# Patient Record
Sex: Male | Born: 2000 | Race: Black or African American | Hispanic: No | Marital: Single | Smoking: Never smoker
Health system: Southern US, Community
[De-identification: ages and names within clinical notes are randomized; demographics above are authoritative.]

## PROBLEM LIST (undated history)

## (undated) DIAGNOSIS — F419 Anxiety disorder, unspecified: Secondary | ICD-10-CM

---
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
## 2019-04-05 ENCOUNTER — Encounter (HOSPITAL_BASED_OUTPATIENT_CLINIC_OR_DEPARTMENT_OTHER): Payer: Self-pay | Admitting: *Deleted

## 2019-04-05 ENCOUNTER — Emergency Department (HOSPITAL_BASED_OUTPATIENT_CLINIC_OR_DEPARTMENT_OTHER)
Admission: EM | Admit: 2019-04-05 | Discharge: 2019-04-05 | Disposition: A | Discharge status: Home / Self Care | Payer: Medicaid Other | Attending: Emergency Medicine | Admitting: Emergency Medicine

## 2019-04-05 ENCOUNTER — Other Ambulatory Visit: Payer: Self-pay

## 2019-04-05 DIAGNOSIS — Z113 Encounter for screening for infections with a predominantly sexual mode of transmission: Secondary | ICD-10-CM | POA: Diagnosis not present

## 2019-04-05 DIAGNOSIS — N342 Other urethritis: Secondary | ICD-10-CM | POA: Diagnosis not present

## 2019-04-05 DIAGNOSIS — Z79899 Other long term (current) drug therapy: Secondary | ICD-10-CM | POA: Insufficient documentation

## 2019-04-05 DIAGNOSIS — R300 Dysuria: Secondary | ICD-10-CM | POA: Diagnosis present

## 2019-04-05 LAB — URINALYSIS, ROUTINE W REFLEX MICROSCOPIC
Glucose, UA: NEGATIVE mg/dL
Hgb urine dipstick: NEGATIVE
Ketones, ur: 15 mg/dL — AB
Nitrite: NEGATIVE
Protein, ur: NEGATIVE mg/dL
Specific Gravity, Urine: >1.03 — ABNORMAL HIGH (ref 1.005–1.030)
pH: 6 (ref 5.0–8.0)

## 2019-04-05 LAB — URINALYSIS, MICROSCOPIC (REFLEX)
Bacteria, UA: NONE SEEN
RBC / HPF: NONE SEEN RBC/hpf (ref 0–5)
Squamous Epithelial / HPF: NONE SEEN (ref 0–5)
WBC, UA: >50 WBC/hpf (ref 0–5)

## 2019-04-05 MED ORDER — AZITHROMYCIN 250 MG PO TABS: 1000.0000 mg | ORAL_TABLET | Freq: Once | ORAL | Status: DC

## 2019-04-05 MED ORDER — CEFTRIAXONE SODIUM 250 MG IJ SOLR
250.0000 mg | Freq: Once | INTRAMUSCULAR | Status: AC
  Administered 2019-04-05: 23:00:00 250 mg via INTRAMUSCULAR
  Filled 2019-04-05: qty 250

## 2019-04-05 MED ORDER — LIDOCAINE HCL (PF) 1 % IJ SOLN
INTRAMUSCULAR | Status: AC
  Filled 2019-04-05: qty 5

## 2019-04-05 MED ORDER — AZITHROMYCIN 1 G PO PACK
1.0000 g | PACK | Freq: Once | ORAL | Status: AC
  Administered 2019-04-05: 23:00:00 1 g via ORAL
  Filled 2019-04-05: qty 1

## 2019-04-05 NOTE — ED Provider Notes (Signed)
[facility] EMERGENCY DEPARTMENT Provider Note   CSN: [id number] Arrival date & time: 04/05/19  2200    History   Chief Complaint Chief Complaint  Patient presents with  . Dysuria    HPI Albert Gardner is a 18 y.o. male.     Patient is a 18 year old male with no significant past medical history.  He presents today with complaints of burning with urination and penile discharge.  This is been ongoing for the past 2 days.  He reports unprotected sex with another male partner 1 week ago.  He denies any rashes, blisters, or lesions on his genitalia.  He does report a discharge to his underwear throughout the day.  The history is provided by the patient.  Dysuria  Presenting symptoms: dysuria and penile discharge   Relieved by:  Nothing Worsened by:  Nothing Ineffective treatments:  None tried Associated symptoms: urinary incontinence   Associated symptoms: no fever     Past Medical History:  Diagnosis Date  . Anxiety     There are no active problems to display for this patient.   History reviewed. No pertinent surgical history.      Home Medications    Prior to Admission medications   Medication Sig Start Date End Date Taking? Authorizing Provider  TRUVADA 200-300 MG tablet Take 1 tablet by mouth See admin instructions. Daily at 4pm 11/03/17   [provider]    Family History No family history on file.  Social History Social History   Tobacco Use  . Smoking status: Never Smoker  . Smokeless tobacco: Never Used  Substance Use Topics  . Alcohol use: Yes    Comment: occasional   . Drug use: No     Allergies   Patient has no known allergies.   Review of Systems Review of Systems  Constitutional: Negative for fever.  Genitourinary: Positive for bladder incontinence, discharge and dysuria.  All other systems reviewed and are negative.    Physical Exam Updated Vital Signs BP 136/76 (BP Location: Right Arm)   Pulse 96   Temp  99 F (37.2 C) (Oral)   Resp 16   Ht 5\' 7"  (1.702 m)   Wt 63.5 kg   SpO2 100%   BMI 21.93 kg/m   Physical Exam Vitals signs and nursing note reviewed.  Constitutional:      General: He is not in acute distress.    Appearance: He is well-developed. He is not diaphoretic.  HENT:     Head: Normocephalic and atraumatic.  Neck:     Musculoskeletal: Normal range of motion and neck supple.  Cardiovascular:     Heart sounds: No murmur. No friction rub.  Pulmonary:     Effort: Pulmonary effort is normal.  Musculoskeletal: Normal range of motion.  Skin:    General: Skin is warm and dry.  Neurological:     Mental Status: He is alert and oriented to person, place, and time.     Coordination: Coordination normal.      ED Treatments / Results  Labs (all labs ordered are listed, but only abnormal results are displayed) Labs Reviewed  URINALYSIS, ROUTINE W REFLEX MICROSCOPIC  RPR  HIV ANTIBODY (ROUTINE TESTING W REFLEX)  GC/CHLAMYDIA PROBE AMP ([place]) NOT [facility]    EKG None  Radiology No results found.  Procedures Procedures (including critical care time)  Medications Ordered in ED Medications - No data to display   Initial Impression / Assessment and Plan / ED  Course  I have reviewed the triage vital signs and the nursing notes.  Pertinent labs & imaging results that were available during my care of the patient were reviewed by me and considered in my medical decision making (see chart for details).  Patient presenting with complaints of dysuria and penile discharge 1 week after unprotected sex with another male partner.  Presentation most consistent with an STD.  Urinalysis shows small leukocytes but many white cells.  He is negative for nitrate.  Patient will be treated with Rocephin and Zithromax for presumed STD.  Patient requested HIV testing.  This along with an RPR are currently pending.  Patient will be discharged and is to return as needed for any  problems.  Final Clinical Impressions(s) / ED Diagnoses   Final diagnoses:  None    ED Discharge Orders    None       Geoffery Lyons, MD 04/05/19 2250

## 2019-04-05 NOTE — ED Triage Notes (Signed)
Burning urination x 3 days and discharge from penis x 1 day per pt report

## 2019-04-05 NOTE — ED Notes (Signed)
ED Provider at bedside. 

## 2019-04-05 NOTE — Discharge Instructions (Signed)
We will call you if your cultures or blood work indicate you require further treatment or action.

## 2019-04-07 LAB — HIV ANTIBODY (ROUTINE TESTING W REFLEX): HIV Screen 4th Generation wRfx: NONREACTIVE

## 2019-04-07 LAB — GC/CHLAMYDIA PROBE AMP (~~LOC~~) NOT AT ARMC
Chlamydia: NEGATIVE
Neisseria Gonorrhea: POSITIVE — AB

## 2019-04-09 LAB — RPR, QUANT+TP ABS (REFLEX)
Rapid Plasma Reagin, Quant: 1:32 {titer} — ABNORMAL HIGH
T Pallidum Abs: REACTIVE — AB

## 2019-04-09 LAB — RPR: RPR Ser Ql: REACTIVE — AB

## 2019-05-19 ENCOUNTER — Emergency Department (HOSPITAL_BASED_OUTPATIENT_CLINIC_OR_DEPARTMENT_OTHER)
Admission: EM | Admit: 2019-05-19 | Discharge: 2019-05-19 | Disposition: A | Discharge status: Home / Self Care | Payer: Medicaid Other | Attending: Emergency Medicine | Admitting: Emergency Medicine

## 2019-05-19 ENCOUNTER — Other Ambulatory Visit: Payer: Self-pay

## 2019-05-19 ENCOUNTER — Encounter (HOSPITAL_BASED_OUTPATIENT_CLINIC_OR_DEPARTMENT_OTHER): Payer: Self-pay | Admitting: Emergency Medicine

## 2019-05-19 DIAGNOSIS — M791 Myalgia, unspecified site: Secondary | ICD-10-CM | POA: Diagnosis present

## 2019-05-19 DIAGNOSIS — U071 COVID-19: Secondary | ICD-10-CM

## 2019-05-19 DIAGNOSIS — B349 Viral infection, unspecified: Secondary | ICD-10-CM | POA: Diagnosis not present

## 2019-05-19 NOTE — ED Notes (Signed)
Pt asking for something to eat; informed that EDP will need to see him first

## 2019-05-19 NOTE — ED Provider Notes (Signed)
[facility] EMERGENCY DEPARTMENT Provider Note   CSN: [id number] Arrival date & time: 05/19/19  1008     History   Chief Complaint No chief complaint on file.   HPI Albert Gardner is a 18 y.o. male.     HPI   17yM with multiple complaints. ~2d hx of fatigue, mild myalgias, sore throat, nausea. Occasional cough. No dyspnea. No fever. No diarrhea. No known sick contacts but is concerned about COVID.   Past Medical History:  Diagnosis Date  . Anxiety     There are no active problems to display for this patient.   History reviewed. No pertinent surgical history.     Home Medications    Prior to Admission medications   Medication Sig Start Date End Date Taking? Authorizing Provider  TRUVADA 200-300 MG tablet Take 1 tablet by mouth See admin instructions. Daily at 4pm 11/03/17   [provider]    Family History No family history on file.  Social History Social History   Tobacco Use  . Smoking status: Never Smoker  . Smokeless tobacco: Never Used  Substance Use Topics  . Alcohol use: Yes    Comment: occasional   . Drug use: No     Allergies   Patient has no known allergies.   Review of Systems Review of Systems  All systems reviewed and negative, other than as noted in HPI.  Physical Exam Updated Vital Signs BP 100/65 (BP Location: Right Arm)   Pulse 86   Temp 98.9 F (37.2 C) (Oral)   Resp 18   Ht 5\' 6"  (1.676 m)   Wt 63.5 kg   SpO2 100%   BMI 22.60 kg/m   Physical Exam Vitals signs and nursing note reviewed.  Constitutional:      General: He is not in acute distress.    Appearance: He is well-developed.  HENT:     Head: Normocephalic and atraumatic.  Eyes:     General:        Right eye: No discharge.        Left eye: No discharge.     Conjunctiva/sclera: Conjunctivae normal.  Neck:     Musculoskeletal: Neck supple.  Cardiovascular:     Rate and Rhythm: Normal rate and regular rhythm.     Heart sounds:  Normal heart sounds. No murmur. No friction rub. No gallop.   Pulmonary:     Effort: Pulmonary effort is normal. No respiratory distress.     Breath sounds: Normal breath sounds.  Abdominal:     General: There is no distension.     Palpations: Abdomen is soft.     Tenderness: There is no abdominal tenderness.  Musculoskeletal:        General: No tenderness.  Skin:    General: Skin is warm and dry.  Neurological:     Mental Status: He is alert.  Psychiatric:        Behavior: Behavior normal.        Thought Content: Thought content normal.      ED Treatments / Results  Labs (all labs ordered are listed, but only abnormal results are displayed) Labs Reviewed  NOVEL CORONAVIRUS, NAA (HOSPITAL ORDER, SEND-OUT TO REF LAB) - Abnormal; Notable for the following components:      Result Value   SARS-CoV-2, NAA DETECTED (*)    All other components within normal limits    EKG    Radiology No results found.  Procedures Procedures (including critical care time)  Medications Ordered in ED Medications - No data to display   Initial Impression / Assessment and Plan / ED Course  I have reviewed the triage vital signs and the nursing notes.  Pertinent labs & imaging results that were available during my care of the patient were reviewed by me and considered in my medical decision making (see chart for details).     17yM with numerous complaints. Possible viral illness. He is concerned about COVID. If so, symptoms mild. Nontoxic. Not hypoxic. Should be fine with his age and past medical hx. Symptomatic tx. Return precautions discussed. FU as needed. Quarantine until results back.  Of note, at time of chart completion COVID back and positive. [place] notified and advised of appropriate quarantine/care.   Albert Gardner was evaluated in Emergency Department on 05/19/2019 for the symptoms described in the history of present illness. He was evaluated in the context of the global  COVID-19 pandemic, which necessitated consideration that the patient might be at risk for infection with the SARS-CoV-2 virus that causes COVID-19. Institutional protocols and algorithms that pertain to the evaluation of patients at risk for COVID-19 are in a state of rapid change based on information released by regulatory bodies including the CDC and federal and state organizations. These policies and algorithms were followed during the patient's care in the ED.   Final Clinical Impressions(s) / ED Diagnoses   Final diagnoses:  Viral syndrome  COVID-19 virus infection    ED Discharge Orders    None       Virgel Manifold, MD 05/22/19 [phone]

## 2019-05-19 NOTE — ED Triage Notes (Signed)
Pt presents with multiple complaints of fever, body aches , vomiting and headache, sore throat for 2 days.  Pt works at [employer].

## 2019-05-20 ENCOUNTER — Telehealth (HOSPITAL_BASED_OUTPATIENT_CLINIC_OR_DEPARTMENT_OTHER): Payer: Self-pay

## 2019-05-20 NOTE — Telephone Encounter (Signed)
Pt called with questions about his positive Sars-CoV2 test. Pt advised on self-quarentine and not returning to work until symptom free for 3 days. Pt given return precautions for ShOB, uncontrolled fevers, or other concerns.

## 2019-05-21 LAB — NOVEL CORONAVIRUS, NAA (HOSP ORDER, SEND-OUT TO REF LAB; TAT 18-24 HRS): SARS-CoV-2, NAA: DETECTED — AB
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
## 2020-02-21 ENCOUNTER — Encounter (HOSPITAL_BASED_OUTPATIENT_CLINIC_OR_DEPARTMENT_OTHER): Payer: Self-pay | Admitting: Emergency Medicine

## 2020-02-21 ENCOUNTER — Emergency Department (HOSPITAL_BASED_OUTPATIENT_CLINIC_OR_DEPARTMENT_OTHER)
Admission: EM | Admit: 2020-02-21 | Discharge: 2020-02-21 | Disposition: A | Discharge status: Home / Self Care | Payer: Medicaid Other | Attending: Emergency Medicine | Admitting: Emergency Medicine

## 2020-02-21 ENCOUNTER — Other Ambulatory Visit: Payer: Self-pay

## 2020-02-21 DIAGNOSIS — K2900 Acute gastritis without bleeding: Secondary | ICD-10-CM | POA: Insufficient documentation

## 2020-02-21 DIAGNOSIS — R0789 Other chest pain: Secondary | ICD-10-CM | POA: Insufficient documentation

## 2020-02-21 DIAGNOSIS — Z79899 Other long term (current) drug therapy: Secondary | ICD-10-CM | POA: Insufficient documentation

## 2020-02-21 DIAGNOSIS — R1110 Vomiting, unspecified: Secondary | ICD-10-CM | POA: Diagnosis present

## 2020-02-21 LAB — CBC
HCT: 48.2 % (ref 39.0–52.0)
Hemoglobin: 16.4 g/dL (ref 13.0–17.0)
MCH: 32 pg (ref 26.0–34.0)
MCHC: 34 g/dL (ref 30.0–36.0)
MCV: 94 fL (ref 80.0–100.0)
Platelets: 343 10*3/uL (ref 150–400)
RBC: 5.13 MIL/uL (ref 4.22–5.81)
RDW: 12.1 % (ref 11.5–15.5)
WBC: 6.2 10*3/uL (ref 4.0–10.5)
nRBC: 0 % (ref 0.0–0.2)

## 2020-02-21 LAB — URINALYSIS, ROUTINE W REFLEX MICROSCOPIC
Glucose, UA: NEGATIVE mg/dL
Hgb urine dipstick: NEGATIVE
Ketones, ur: >80 mg/dL — AB
Leukocytes,Ua: NEGATIVE
Nitrite: NEGATIVE
Protein, ur: NEGATIVE mg/dL
Specific Gravity, Urine: >1.03 — ABNORMAL HIGH (ref 1.005–1.030)
pH: 6 (ref 5.0–8.0)

## 2020-02-21 LAB — COMPREHENSIVE METABOLIC PANEL
ALT: 8 U/L (ref 0–44)
AST: 14 U/L — ABNORMAL LOW (ref 15–41)
Albumin: 5 g/dL (ref 3.5–5.0)
Alkaline Phosphatase: 47 U/L (ref 38–126)
Anion gap: 15 (ref 5–15)
BUN: 14 mg/dL (ref 6–20)
CO2: 25 mmol/L (ref 22–32)
Calcium: 9.8 mg/dL (ref 8.9–10.3)
Chloride: 99 mmol/L (ref 98–111)
Creatinine, Ser: 0.94 mg/dL (ref 0.61–1.24)
GFR calc Af Amer: >60 mL/min (ref >60)
GFR calc non Af Amer: >60 mL/min (ref >60)
Glucose, Bld: 85 mg/dL (ref 70–99)
Potassium: 4.1 mmol/L (ref 3.5–5.1)
Sodium: 139 mmol/L (ref 135–145)
Total Bilirubin: 1.8 mg/dL — ABNORMAL HIGH (ref 0.3–1.2)
Total Protein: 8.1 g/dL (ref 6.5–8.1)

## 2020-02-21 LAB — RAPID URINE DRUG SCREEN, HOSP PERFORMED
Amphetamines: NOT DETECTED
Barbiturates: NOT DETECTED
Benzodiazepines: NOT DETECTED
Cocaine: NOT DETECTED
Opiates: NOT DETECTED
Tetrahydrocannabinol: POSITIVE — AB

## 2020-02-21 LAB — LIPASE, BLOOD: Lipase: 18 U/L (ref 11–51)

## 2020-02-21 MED ORDER — ONDANSETRON HCL 4 MG/2ML IJ SOLN
4.0000 mg | Freq: Once | INTRAMUSCULAR | Status: AC | PRN
  Administered 2020-02-21: 4 mg via INTRAVENOUS
  Filled 2020-02-21: qty 2

## 2020-02-21 MED ORDER — SODIUM CHLORIDE 0.9 % IV BOLUS
1000.0000 mL | Freq: Once | INTRAVENOUS | Status: AC
  Administered 2020-02-21: 1000 mL via INTRAVENOUS

## 2020-02-21 MED ORDER — FAMOTIDINE 20 MG PO TABS: 20.0000 mg | ORAL_TABLET | Freq: Two times a day (BID) | ORAL | 0 refills | Status: DC

## 2020-02-21 MED ORDER — FAMOTIDINE 20 MG PO TABS
20.0000 mg | ORAL_TABLET | Freq: Once | ORAL | Status: AC
  Administered 2020-02-21: 20 mg via ORAL
  Filled 2020-02-21: qty 1

## 2020-02-21 MED ORDER — ONDANSETRON HCL 4 MG PO TABS: 4.0000 mg | ORAL_TABLET | Freq: Every day | ORAL | 0 refills | Status: AC | PRN

## 2020-02-21 MED ORDER — ALUM & MAG HYDROXIDE-SIMETH 200-200-20 MG/5ML PO SUSP
30.0000 mL | Freq: Once | ORAL | Status: AC
  Administered 2020-02-21: 08:00:00 30 mL via ORAL
  Filled 2020-02-21: qty 30

## 2020-02-21 NOTE — ED Triage Notes (Signed)
Pt c/o n/v/d, abd pain, chest pain x 1 week. Pt reports being under stress.

## 2020-02-21 NOTE — ED Notes (Signed)
ED Provider at bedside. 

## 2020-02-21 NOTE — Discharge Instructions (Signed)
While you are here in the emergency room, we were able to run some blood tests and look at your kidneys and gallbladder.  There is not seem to be any dangerous reason for your stomach pain and nausea.  You do not need to stay in the hospital to receive treatment.  You do look like you have are little bit dehydrated from all the vomiting.  Your stomach pain likely has a number of contributors.  Your stress is not likely helping in its probably also worsened from your recent Advil use.  For the next 2 weeks, I recommend taking the famotidine that we have prescribed you.  I have also provided some antinausea medication called Zofran you can use as needed.    To establish care with a primary care physician and receive treatment for anxiety, I have provided information for the [facility].  You can also try calling the number noted on your Medicaid card to find out where you can establish care.

## 2020-02-21 NOTE — ED Provider Notes (Signed)
[facility] EMERGENCY DEPARTMENT Provider Note   CSN: [id number] Arrival date & time: 02/21/20  1740     History Chief Complaint  Patient presents with  . Emesis    Albert Gardner is a 19 y.o. male.  Mr. Hoyle Barr is a 19 year old man who presents to the ED with 1 week of stomach pain, vomiting and chest pain.  He reports that his stomach pain is primarily in the center of his stomach and feels like a stabbing discomfort.  He also notices this pain in the upper left side of his stomach at times.  He has been having 4-6 episodes of NB/NB emesis daily for the past week.  He has been taking Advil every day without significant improvement.  He has not been having any diarrhea or blood in his bowel movements.  With regard to his chest pain, he reports a pressure-like sensation over the center and left side of his chest.  He is not noted any radiation to his arm, back, jaw.  Not sure if this is worse with exertion.  He has not taken anything for the chest pain except Advil.  He has been resistant to coming to the hospital but his sister recently encouraged him to be assessed because his symptoms have been ongoing for about 1 week.  He is under the impression that his symptoms are likely stress related.  He recently lost his mother abruptly when she died of a brain bleed.  He has also been under significant financial strain recently.  He frequently smokes black and milds and only occasionally uses marijuana.  He experienced a COVID-19 infection in June 2020.  His symptoms today feel very different from his Covid infection.  He has no known exposure to Covid and has been working from home recently.        Past Medical History:  Diagnosis Date  . Anxiety     There are no problems to display for this patient.   History reviewed. No pertinent surgical history.     No family history on file.  Social History   Tobacco Use  . Smoking status: Never Smoker  . Smokeless tobacco:  Never Used  Substance Use Topics  . Alcohol use: Yes    Comment: occasional   . Drug use: No    Home Medications Prior to Admission medications   Medication Sig Start Date End Date Taking? Authorizing Provider  Ibuprofen-diphenhydrAMINE Cit (ADVIL PM PO) Take by mouth.   Yes [provider]  TRUVADA 200-300 MG tablet Take 1 tablet by mouth See admin instructions. Daily at 4pm 11/03/17   [provider]    Allergies    Patient has no known allergies.  Review of Systems   Review of Systems  Constitutional: Positive for appetite change and fever (subjective).  HENT: Negative for congestion and sore throat.   Respiratory: Positive for chest tightness. Negative for cough, shortness of breath and wheezing.   Cardiovascular: Positive for chest pain and palpitations. Negative for leg swelling.  Gastrointestinal: Positive for abdominal pain, nausea and vomiting. Negative for blood in stool, constipation and diarrhea.  Endocrine: Positive for polyuria.  Genitourinary: Negative for dysuria.  Musculoskeletal: Positive for myalgias. Negative for arthralgias.  Neurological: Positive for headaches.    Physical Exam Updated Vital Signs BP 127/87   Pulse 76   Temp 98.4 F (36.9 C) (Oral)   Resp (!) 22   Ht 5\' 6"  (1.676 m)   Wt 61.2 kg   SpO2  100%   BMI 21.79 kg/m   Physical Exam Constitutional:      Appearance: He is normal weight.  HENT:     Head: Normocephalic and atraumatic.     Nose: Nose normal.     Mouth/Throat:     Mouth: Mucous membranes are moist.     Pharynx: Oropharynx is clear.  Eyes:     Conjunctiva/sclera: Conjunctivae normal.     Pupils: Pupils are equal, round, and reactive to light.  Cardiovascular:     Rate and Rhythm: Normal rate and regular rhythm.     Pulses: Normal pulses.     Heart sounds: Normal heart sounds. No murmur.  Pulmonary:     Effort: Pulmonary effort is normal. No respiratory distress.     Breath sounds: Normal breath  sounds. No wheezing or rales.  Abdominal:     General: Abdomen is flat. Bowel sounds are normal. There is no distension.     Tenderness: There is abdominal tenderness. There is guarding. There is no right CVA tenderness or left CVA tenderness.     Comments: Most tender over the left upper quadrant and periumbilical area.  Minimal right upper quadrant pain no left lower quadrant pain.  Musculoskeletal:        General: No swelling.     Cervical back: Normal range of motion.     Right lower leg: No edema.     Left lower leg: No edema.  Neurological:     Mental Status: He is alert.     ED Results / Procedures / Treatments   Labs (all labs ordered are listed, but only abnormal results are displayed) Labs Reviewed  COMPREHENSIVE METABOLIC PANEL - Abnormal; Notable for the following components:      Result Value   AST 14 (*)    Total Bilirubin 1.8 (*)    All other components within normal limits  LIPASE, BLOOD  CBC    EKG EKG Interpretation  Date/Time:  Saturday February 21 2020 06:54:23 EDT Ventricular Rate:  84 PR Interval:    QRS Duration: 90 QT Interval:  350 QTC Calculation: 414 R Axis:   99 Text Interpretation: Sinus rhythm Borderline right axis deviation ST elev, probable normal early repol pattern No previous ECGs available Confirmed by Frederick Peers [phone]) on 02/21/2020 7:09:07 AM   Radiology No results found.  Procedures Procedures (including critical care time)  Medications Ordered in ED Medications  ondansetron (ZOFRAN) injection 4 mg (4 mg Intravenous Given 02/21/20 0706)    ED Course  I have reviewed the triage vital signs and the nursing notes.  Pertinent labs & imaging results that were available during my care of the patient were reviewed by me and considered in my medical decision making (see chart for details).    MDM Rules/Calculators/A&P                      Mr. Ellerby is a 19 year old man presenting to the ED with 1 week of stomach pain,  vomiting, chest pain.  Very high suspicion for stress related symptoms and stomach irritation secondary to NSAID use.  Lipase is not elevated, AST ALT showed no elevation.  Serum glucose is within normal limits, no concern for DKA.  Will assess urine for possible infection and marijuana.  Overall, low suspicion for infectious etiology.  No acute abdomen.  Will begin mild fluid resuscitation and GI cocktail.  significant improvement following the GI cocktail and fluids.  No concerning lab findings.  PO challenge successful.  Most likely gastritis vs viral gastro. Leaning toward gastritis. Sent home with famotidine and zofran. Advised to follow up with PCP for anxiety.  He was also advised to quit smoking MJ.  Final Clinical Impression(s) / ED Diagnoses Final diagnoses:  None    Rx / DC Orders ED Discharge Orders    None       Mirian Mo, MD 02/21/20 1048    Little, Ambrose Finland, MD 02/21/20 1052

## 2020-07-27 ENCOUNTER — Other Ambulatory Visit: Payer: Self-pay | Admitting: Nurse Practitioner

## 2020-07-27 DIAGNOSIS — K219 Gastro-esophageal reflux disease without esophagitis: Secondary | ICD-10-CM

## 2020-07-27 DIAGNOSIS — R109 Unspecified abdominal pain: Secondary | ICD-10-CM

## 2020-07-27 DIAGNOSIS — R112 Nausea with vomiting, unspecified: Secondary | ICD-10-CM

## 2020-08-09 ENCOUNTER — Other Ambulatory Visit: Payer: Medicaid Other

## 2022-10-16 ENCOUNTER — Encounter (HOSPITAL_COMMUNITY): Payer: Self-pay | Admitting: Emergency Medicine

## 2022-10-16 ENCOUNTER — Emergency Department (HOSPITAL_COMMUNITY)
Admission: EM | Admit: 2022-10-16 | Discharge: 2022-10-16 | Disposition: A | Discharge status: Home / Self Care | Payer: Medicaid Other | Attending: Emergency Medicine | Admitting: Emergency Medicine

## 2022-10-16 ENCOUNTER — Other Ambulatory Visit: Payer: Self-pay

## 2022-10-16 DIAGNOSIS — R1084 Generalized abdominal pain: Secondary | ICD-10-CM | POA: Insufficient documentation

## 2022-10-16 DIAGNOSIS — Z21 Asymptomatic human immunodeficiency virus [HIV] infection status: Secondary | ICD-10-CM | POA: Insufficient documentation

## 2022-10-16 DIAGNOSIS — R7401 Elevation of levels of liver transaminase levels: Secondary | ICD-10-CM | POA: Insufficient documentation

## 2022-10-16 DIAGNOSIS — R197 Diarrhea, unspecified: Secondary | ICD-10-CM | POA: Insufficient documentation

## 2022-10-16 DIAGNOSIS — Z1152 Encounter for screening for COVID-19: Secondary | ICD-10-CM | POA: Insufficient documentation

## 2022-10-16 DIAGNOSIS — R112 Nausea with vomiting, unspecified: Secondary | ICD-10-CM | POA: Insufficient documentation

## 2022-10-16 LAB — COMPREHENSIVE METABOLIC PANEL
ALT: <5 U/L (ref 0–44)
AST: 14 U/L — ABNORMAL LOW (ref 15–41)
Albumin: 4.3 g/dL (ref 3.5–5.0)
Alkaline Phosphatase: 38 U/L (ref 38–126)
Anion gap: 11 (ref 5–15)
BUN: 12 mg/dL (ref 6–20)
CO2: 24 mmol/L (ref 22–32)
Calcium: 9.3 mg/dL (ref 8.9–10.3)
Chloride: 106 mmol/L (ref 98–111)
Creatinine, Ser: 0.96 mg/dL (ref 0.61–1.24)
GFR, Estimated: >60 mL/min (ref >60)
Glucose, Bld: 88 mg/dL (ref 70–99)
Potassium: 4.3 mmol/L (ref 3.5–5.1)
Sodium: 141 mmol/L (ref 135–145)
Total Bilirubin: 0.9 mg/dL (ref 0.3–1.2)
Total Protein: 6.7 g/dL (ref 6.5–8.1)

## 2022-10-16 LAB — URINALYSIS, ROUTINE W REFLEX MICROSCOPIC
Bilirubin Urine: NEGATIVE
Glucose, UA: NEGATIVE mg/dL
Hgb urine dipstick: NEGATIVE
Ketones, ur: 80 mg/dL — AB
Leukocytes,Ua: NEGATIVE
Nitrite: NEGATIVE
Protein, ur: NEGATIVE mg/dL
Specific Gravity, Urine: 1.032 — ABNORMAL HIGH (ref 1.005–1.030)
pH: 5 (ref 5.0–8.0)

## 2022-10-16 LAB — RESP PANEL BY RT-PCR (FLU A&B, COVID) ARPGX2
Influenza A by PCR: NEGATIVE
Influenza B by PCR: NEGATIVE
SARS Coronavirus 2 by RT PCR: NEGATIVE

## 2022-10-16 LAB — CBC
HCT: 43.3 % (ref 39.0–52.0)
Hemoglobin: 15 g/dL (ref 13.0–17.0)
MCH: 32.6 pg (ref 26.0–34.0)
MCHC: 34.6 g/dL (ref 30.0–36.0)
MCV: 94.1 fL (ref 80.0–100.0)
Platelets: 329 10*3/uL (ref 150–400)
RBC: 4.6 MIL/uL (ref 4.22–5.81)
RDW: 11.9 % (ref 11.5–15.5)
WBC: 7.2 10*3/uL (ref 4.0–10.5)
nRBC: 0 % (ref 0.0–0.2)

## 2022-10-16 LAB — LIPASE, BLOOD: Lipase: 27 U/L (ref 11–51)

## 2022-10-16 LAB — CK: Total CK: 63 U/L (ref 49–397)

## 2022-10-16 MED ORDER — PANTOPRAZOLE SODIUM 20 MG PO TBEC: 20.0000 mg | DELAYED_RELEASE_TABLET | Freq: Every day | ORAL | 0 refills | Status: DC

## 2022-10-16 MED ORDER — PANTOPRAZOLE SODIUM 40 MG IV SOLR
40.0000 mg | Freq: Once | INTRAVENOUS | Status: AC
  Administered 2022-10-16: 40 mg via INTRAVENOUS
  Filled 2022-10-16: qty 10

## 2022-10-16 MED ORDER — LACTATED RINGERS IV BOLUS
1000.0000 mL | Freq: Once | INTRAVENOUS | Status: AC
  Administered 2022-10-16: 1000 mL via INTRAVENOUS

## 2022-10-16 MED ORDER — ONDANSETRON HCL 4 MG PO TABS: 4.0000 mg | ORAL_TABLET | Freq: Four times a day (QID) | ORAL | 0 refills | Status: DC

## 2022-10-16 MED ORDER — ONDANSETRON HCL 4 MG/2ML IJ SOLN
4.0000 mg | Freq: Once | INTRAMUSCULAR | Status: AC
  Administered 2022-10-16: 4 mg via INTRAVENOUS
  Filled 2022-10-16: qty 2

## 2022-10-16 NOTE — ED Provider Notes (Signed)
[name] Provider Note   CSN: [id number] Arrival date & time: 10/16/22  9381     History Chief Complaint  Patient presents with   Abdominal Pain    Albert Gardner is a 21 y.o. male history of anxiety, depression, on HIV PrEP presents to the emergency room and for evaluation of nausea, vomiting, diarrhea, with some mild diffuse abdominal pain since yesterday.  Patient he ate was a [place] cheese steak.  He reports he had 5 episodes of nonbilious, nonbloody emesis.  Reports he had some nausea.  Reports he had about 4 episodes of softer stool that was normal brown color, no melena or hematochezia.  He reports some diffuse abdominal pain but was more concerned by the nausea and vomiting.  Denies any dysuria, hematuria, fever, chest pain, or shortness of breath.  Denies any recent cough or cold symptoms. Reports he does have some body aches.  No medications been taken prior to arrival.   Abdominal Pain Associated symptoms: diarrhea, nausea and vomiting   Associated symptoms: no chest pain, no chills, no constipation, no dysuria, no fever, no hematuria and no shortness of breath        Home Medications Prior to Admission medications   Medication Sig Start Date End Date Taking? Authorizing Provider  famotidine (PEPCID) 20 MG tablet Take 1 tablet (20 mg total) by mouth 2 (two) times daily for 15 days. 02/21/20 03/07/20  Mirian Mo, MD  Ibuprofen-diphenhydrAMINE Cit (ADVIL PM PO) Take by mouth.    [provider]  TRUVADA 200-300 MG tablet Take 1 tablet by mouth See admin instructions. Daily at 4pm 11/03/17   [provider]      Allergies    Patient has no known allergies.    Review of Systems   Review of Systems  Constitutional:  Negative for chills and fever.  Respiratory:  Negative for shortness of breath.   Cardiovascular:  Negative for chest pain.  Gastrointestinal:  Positive for abdominal pain, diarrhea, nausea and  vomiting. Negative for blood in stool and constipation.  Genitourinary:  Negative for dysuria and hematuria.  Musculoskeletal:  Negative for back pain.    Physical Exam Updated Vital Signs BP (!) 123/55   Pulse 63   Temp 97.8 F (36.6 C) (Oral)   Resp 14   Ht 5\' 6"  (1.676 m)   Wt 59 kg   SpO2 100%   BMI 20.98 kg/m  Physical Exam Vitals and nursing note reviewed.  Constitutional:      General: He is not in acute distress.    Appearance: Normal appearance. He is not ill-appearing or toxic-appearing.     Comments: On phone, in no acute distress  HENT:     Head: Normocephalic and atraumatic.  Eyes:     General: No scleral icterus. Cardiovascular:     Rate and Rhythm: Normal rate and regular rhythm.  Pulmonary:     Effort: Pulmonary effort is normal. No respiratory distress.     Breath sounds: Normal breath sounds.  Abdominal:     General: Abdomen is flat. Bowel sounds are normal.     Palpations: Abdomen is soft.     Tenderness: There is generalized abdominal tenderness. There is no right CVA tenderness, left CVA tenderness, guarding or rebound.     Comments: Very mild diffuse abdominal tenderness palpation.  Abdomen is soft, normal active bowel sounds, no guarding or rebound noted.  No overlying skin changes noted.  Musculoskeletal:  General: No deformity.     Cervical back: Normal range of motion.  Skin:    General: Skin is warm and dry.  Neurological:     General: No focal deficit present.     Mental Status: He is alert. Mental status is at baseline.     ED Results / Procedures / Treatments   Labs (all labs ordered are listed, but only abnormal results are displayed) Labs Reviewed  COMPREHENSIVE METABOLIC PANEL - Abnormal; Notable for the following components:      Result Value   AST 14 (*)    All other components within normal limits  URINALYSIS, ROUTINE W REFLEX MICROSCOPIC - Abnormal; Notable for the following components:   Specific Gravity, Urine 1.032  (*)    Ketones, ur 80 (*)    All other components within normal limits  RESP PANEL BY RT-PCR (FLU A&B, COVID) ARPGX2  LIPASE, BLOOD  CBC  CK    EKG None  Radiology No results found.  Procedures Procedures   Medications Ordered in ED Medications  lactated ringers bolus 1,000 mL (1,000 mLs Intravenous New Bag/Given 10/16/22 0908)  ondansetron (ZOFRAN) injection 4 mg (4 mg Intravenous Given 10/16/22 0900)  pantoprazole (PROTONIX) injection 40 mg (40 mg Intravenous Given 10/16/22 0900)    ED Course/ Medical Decision Making/ A&P                           Medical Decision Making Amount and/or Complexity of Data Reviewed Labs: ordered.  Risk Prescription drug management.   21 year old male presents emerged department for evaluation of nausea, vomiting, diarrhea, and generalized abdominal pain.  Differential diagnosis includes limited to pancreatitis, cholecystitis, choledocholithiasis, viral illness, electrolyte abnormality, dehydration.  Vital signs are unremarkable.  Patient normotensive, afebrile, normal pulse rate, satting well on room air without any increased work of breathing.  Please see physical exam as noted above.  We will order labs to start off with.  I independently reviewed and interpreted the patient's labs.  CK level normal.  CMP shows mildly decreased AST at 14 otherwise no electrolyte or LFT abnormalities.  Negative for COVID and flu.  Normal lipase.  CBC without cytosis or anemia.  Urinalysis does show concentrated urine with 80 ketones, likely ketonuria given the patient's nausea and vomiting.  We did shared decision making about CT imaging.  We discussed the risk and benefits.  Patient like to forego imaging at this time.  I think this is reasonable.  I discussed with him if he does have worsening belly pain to presents emerged [facility] for imaging.  Patient's abdominal pain is generalized and is also very very mild tenderness to palpation diffusely.  He does  not have a rigid abdomen.  Do not think this is any surgical abdomen.  It is normal active bowel sounds with no overlying skin changes.  I do not think any CT imaging is needed at this time.  On reevaluation, the patient is feeling better after the fluids, Zofran, and Protonix.  He is eating applesauce and a [place] sandwich.  No emesis.  Patient overall is lying calmly in bed on his phone, watching television, in no acute distress.  Vital signs have remained stable.  We will discharge him home with Protonix and Zofran.  Patient asked for note off for the next 4 days.  I expressed patient and we will give him a note for the next 3, will sit and go back to work on Thursday.  We discussed return precautions about symptoms.  Discussed this new medications of Protonix and Zofran.  Patient verbalizes understanding agrees the plan.  Patient is stable being discharged home in good condition.  Final Clinical Impression(s) / ED Diagnoses Final diagnoses:  Nausea vomiting and diarrhea  Generalized abdominal pain    Rx / DC Orders ED Discharge Orders          Ordered    pantoprazole (PROTONIX) 20 MG tablet  Daily        10/16/22 1203    ondansetron (ZOFRAN) 4 MG tablet  Every 6 hours        10/16/22 1203              Achille Rich, PA-C 10/16/22 1328    Melene Plan, DO 10/16/22 1424

## 2022-10-16 NOTE — ED Notes (Signed)
CK was added to blood in Lab

## 2022-10-16 NOTE — Discharge Instructions (Addendum)
You were seen in the ER today for evaluation of your nausea, vomiting, diarrhea, and abdominal pain. Your lab results were unremarkable.  You have decided that you do not want any imaging.  I am sending him to prescriptions.  One is to take for your abdominal pain, the other day for nausea as needed.  Please make sure you are staying well-hydrated drinking plenty of fluids, mainly water.  If you have any concerns, new or worsening symptoms, please return to the nearest emergency room for evaluation.  Contact a doctor if: Your belly pain changes or gets worse. You are not hungry, or you lose weight without trying. You are having trouble pooping (constipated) or have watery poop (diarrhea) for more than 2-3 days. You have pain when you pee or poop. Your belly pain wakes you up at night. Your pain gets worse with meals, after eating, or with certain foods. You are vomiting and cannot keep anything down. You have a fever. You have blood in your pee. Get help right away if: Your pain does not go away as soon as your doctor says it should. You cannot stop vomiting. Your pain is only in areas of your belly, such as the right side or the left lower part of the belly. You have bloody or black poop, or poop that looks like tar. You have very bad pain, cramping, or bloating in your belly. You have signs of not having enough fluid or water in your body (dehydration), such as: Dark pee, very little pee, or no pee. Cracked lips. Dry mouth. Sunken eyes. Sleepiness. Weakness. You have trouble breathing or chest pain.

## 2022-10-16 NOTE — ED Triage Notes (Signed)
Pt reported to ED with c/o diffuse abdominal pain, nausea/vomiting and diarrhea.Pt states he was told before that he is prone to stomach ulcers but does not take medication for the same.

## 2023-04-16 ENCOUNTER — Ambulatory Visit (HOSPITAL_COMMUNITY)
Admission: EM | Admit: 2023-04-16 | Discharge: 2023-04-16 | Disposition: A | Discharge status: Home / Self Care | Payer: Medicaid Other | Attending: Family Medicine | Admitting: Family Medicine

## 2023-04-16 ENCOUNTER — Encounter (HOSPITAL_COMMUNITY): Payer: Self-pay

## 2023-04-16 DIAGNOSIS — K529 Noninfective gastroenteritis and colitis, unspecified: Secondary | ICD-10-CM

## 2023-04-16 MED ORDER — ONDANSETRON 4 MG PO TBDP: 4.0000 mg | ORAL_TABLET | Freq: Three times a day (TID) | ORAL | 0 refills | Status: DC | PRN

## 2023-04-16 NOTE — ED Triage Notes (Signed)
Pt states last night started feeling hot and cold then started having vomiting and diarrhea. States sent home from work today for vomiting and will need a note.denies taking any meds.

## 2023-04-16 NOTE — ED Provider Notes (Signed)
  [facility]   [id number] 04/16/23 Arrival Time: 1110  ASSESSMENT & PLAN:  1. Gastroenteritis    Meds ordered this encounter  Medications   ondansetron (ZOFRAN-ODT) 4 MG disintegrating tablet    Sig: Take 1 tablet (4 mg total) by mouth every 8 (eight) hours as needed for nausea or vomiting.    Dispense:  20 tablet    Refill:  0   Discussed typical duration of symptoms for suspected viral GI illness. Will do his best to ensure adequate fluid intake in order to avoid dehydration. Will proceed to the Emergency Department for evaluation if unable to tolerate PO fluids regularly. Work note provided.  Otherwise he will f/u with his PCP or here if not showing improvement over the next 48-72 hours.  Reviewed expectations re: course of current medical issues. Questions answered. Outlined signs and symptoms indicating need for more acute intervention. Patient verbalized understanding. After Visit Summary given.   SUBJECTIVE: History from: patient. Albert Gardner is a 22 y.o. male who presents with complaint of non-bilious, non-bloody intermittent n/v with non-bloody diarrhea. Onset yesterday. Abdominal discomfort: mild and cramping. Symptoms are unchanged since beginning. Aggravating factors: eating. Alleviating factors: none. Associated symptoms: fatigue. He denies fever. Appetite: decreased. PO intake: decreased. Ambulatory without assistance. Urinary symptoms: none. Sick contacts: none. Recent travel or camping: none. OTC treatment: none.  History reviewed. No pertinent surgical history.  OBJECTIVE:  Vitals:   04/16/23 1347  BP: 130/81  Pulse: 82  Resp: 18  Temp: 98.6 F (37 C)  TempSrc: Oral  SpO2: 99%    General appearance: alert; no distress Oropharynx: slightly dry Lungs: clear to auscultation bilaterally; unlabored Heart: regular rate and rhythm Abdomen: soft; non-distended; no significant abdominal tenderness; bowel sounds present; no masses or  organomegaly; no guarding or rebound tenderness Back: no CVA tenderness Extremities: no edema; symmetrical with no gross deformities Skin: warm; dry Neurologic: normal gait Psychological: alert and cooperative; normal mood and affect  No Known Allergies                                             Past Medical History:  Diagnosis Date   Anxiety    Social History   Socioeconomic History   Marital status: Single    Spouse name: Not on file   Number of children: Not on file   Years of education: Not on file   Highest education level: Not on file  Occupational History   Not on file  Tobacco Use   Smoking status: Never   Smokeless tobacco: Never  Vaping Use   Vaping Use: Some days  Substance and Sexual Activity   Alcohol use: Yes    Comment: occasional    Drug use: No   Sexual activity: Not on file  Other Topics Concern   Not on file  Social History Narrative   Not on file   Social Determinants of Health   Financial Resource Strain: Not on file  Food Insecurity: Not on file  Transportation Needs: Not on file  Physical Activity: Not on file  Stress: Not on file  Social Connections: Not on file  Intimate Partner Violence: Not on file   History reviewed. No pertinent family history.    Mardella Layman, MD 04/16/23 1415

## 2023-04-16 NOTE — Discharge Instructions (Addendum)
Please do your best to ensure adequate fluid intake in order to avoid dehydration. If you find that you are unable to tolerate drinking fluids regularly please proceed to the Emergency Department for evaluation.  Also, you should return to the hospital if you experience fevers, worsening abdominal pain/diarrhea/vomiting, dizziness, syncope (fainting), or for any other concerns you may have.

## 2024-01-17 ENCOUNTER — Encounter (HOSPITAL_COMMUNITY): Payer: Self-pay

## 2024-01-17 ENCOUNTER — Ambulatory Visit (INDEPENDENT_AMBULATORY_CARE_PROVIDER_SITE_OTHER): Payer: Medicaid Other

## 2024-01-17 ENCOUNTER — Ambulatory Visit (HOSPITAL_COMMUNITY)
Admission: EM | Admit: 2024-01-17 | Discharge: 2024-01-17 | Disposition: A | Discharge status: Home / Self Care | Payer: Medicaid Other | Attending: Emergency Medicine | Admitting: Emergency Medicine

## 2024-01-17 DIAGNOSIS — M545 Low back pain, unspecified: Secondary | ICD-10-CM

## 2024-01-17 DIAGNOSIS — R0781 Pleurodynia: Secondary | ICD-10-CM | POA: Diagnosis not present

## 2024-01-17 DIAGNOSIS — S161XXA Strain of muscle, fascia and tendon at neck level, initial encounter: Secondary | ICD-10-CM | POA: Diagnosis not present

## 2024-01-17 DIAGNOSIS — M79605 Pain in left leg: Secondary | ICD-10-CM

## 2024-01-17 MED ORDER — METHOCARBAMOL 500 MG PO TABS: 500.0000 mg | ORAL_TABLET | Freq: Two times a day (BID) | ORAL | 0 refills | Status: AC
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
## 2024-01-17 NOTE — Discharge Instructions (Addendum)
I have prescribed Robaxin which is a muscle relaxer that you can take twice daily as needed for muscle pain and spasms.  This can make you drowsy so do not work, drive, or drink while taking.  Otherwise alternate between Tylenol and ibuprofen every 4-6 hours as needed for pain.  You can also try applying heat to help with pain.   If symptoms persist or worsen you can return here for reevaluation.  If you continue to have ringing in your ear I recommend following up with your with audiologist.
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
## 2024-07-02 ENCOUNTER — Other Ambulatory Visit: Payer: Self-pay | Admitting: Internal Medicine

## 2024-07-02 DIAGNOSIS — Z8249 Family history of ischemic heart disease and other diseases of the circulatory system: Secondary | ICD-10-CM
